# Patient Record
Sex: Male | Born: 1995 | Hispanic: Yes | Smoking: Current every day smoker
Health system: Southern US, Community
[De-identification: ages and names within clinical notes are randomized; demographics above are authoritative.]

---

## 2018-03-14 ENCOUNTER — Other Ambulatory Visit: Payer: Self-pay

## 2018-03-14 ENCOUNTER — Emergency Department: Payer: Self-pay

## 2018-03-14 ENCOUNTER — Emergency Department
Admission: EM | Admit: 2018-03-14 | Discharge: 2018-03-14 | Disposition: A | Discharge status: Home / Self Care | Payer: Self-pay | Attending: Emergency Medicine | Admitting: Emergency Medicine

## 2018-03-14 DIAGNOSIS — M79644 Pain in right finger(s): Secondary | ICD-10-CM | POA: Insufficient documentation

## 2018-03-14 DIAGNOSIS — M7989 Other specified soft tissue disorders: Secondary | ICD-10-CM

## 2018-03-14 DIAGNOSIS — R2231 Localized swelling, mass and lump, right upper limb: Secondary | ICD-10-CM | POA: Insufficient documentation

## 2018-03-14 DIAGNOSIS — W19XXXA Unspecified fall, initial encounter: Secondary | ICD-10-CM

## 2018-03-14 MED ORDER — IBUPROFEN 400 MG PO TABS: 400.0000 mg | ORAL_TABLET | Freq: Three times a day (TID) | ORAL | 0 refills | Status: DC | PRN

## 2018-03-14 NOTE — ED Notes (Signed)
Trip and fall today, report pain to right index finger, swelling and bruising noted when compared to left index finger.

## 2018-03-14 NOTE — ED Provider Notes (Signed)
[facility] Emergency Department Provider Note ____________________________________________  Time seen: 2045  I have reviewed the triage vital signs and the nursing notes.  HISTORY  Chief Complaint  Finger Injury   HPI Jeremy Davila is a 23 y.o. male presents to the ER today with complaint of Davila index finger pain and swelling.  Jeremy reports approximately 2 hours prior to Davila, Jeremy Davila hand.  Jeremy describes the pain as throbbing.  Jeremy denies numbness, tingling or weakness.  Jeremy did not take any medication prior to Davila.  No past medical history on file.  There are no active problems to display for this patient.   Prior to Admission medications   Medication Sig Start Date End Date Taking? Authorizing Provider  ibuprofen (ADVIL,MOTRIN) 400 MG tablet Take 1 tablet (400 mg total) by mouth every 8 (eight) hours as needed. 03/14/18   Lorre Munroe, NP    Allergies Patient has no known allergies.  No family history on file.  Social History Social History   Tobacco Use  . Smoking status: Not on file  Substance Use Topics  . Alcohol use: Not on file  . Drug use: Not on file    Review of Systems  Constitutional: Negative for fever, chills or body aches. Cardiovascular: Negative for chest pain. Respiratory: Negative for shortness of breath. Musculoskeletal: Positive for Davila index finger pain and swelling.   Skin: Positive for bruising of Davila index finger. Neurological: Negative for focal weakness, tingling or numbness. ____________________________________________  PHYSICAL EXAM:  VITAL SIGNS: ED Triage Vitals [03/14/18 1954]  Enc Vitals Group     BP (!) 141/85     Pulse Rate 74     Resp 18     Temp 98.4 F (36.9 C)     Temp Source Oral     SpO2 95 %     Weight 200 lb (90.7 kg)     Height 5\' 9"  (1.753 m)     Head Circumference      Peak Flow      Pain Score 0     Pain Loc    Pain Edu?      Excl. in GC?     Constitutional: Alert and oriented. Well appearing and in no distress. Cardiovascular: Normal rate, regular rhythm.  Radial pulses 2+ bilaterally.  Cap refill less than 3 seconds in Davila index finger. Respiratory: Normal respiratory effort. No wheezes/rales/rhonchi. Gastrointestinal: Soft and nontender. No distention. Musculoskeletal: Normal flexion and extension of the Davila index finger.  Pain with palpation proximal to the PIP Davila index finger.  Handgrips equal. Neurologic: Normal speech and language. No gross focal neurologic deficits are appreciated. Skin: Bruising noted over PIP, Davila index finger..  ______________________________   RADIOLOGY  Imaging Orders     DG Finger Index Davila  ____________________________________________  PROCEDURES  .Splint Application Date/Time: 03/14/2018 8:58 PM Performed by: Rosalio Loud Authorized by: Lorre Munroe, NP   Consent:    Consent obtained:  Verbal   Consent given by:  Patient   Risks discussed:  Discoloration, numbness and swelling   Alternatives discussed:  No treatment Pre-procedure details:    Sensation:  Normal Procedure details:    Laterality:  Davila   Location:  Finger   Finger:  R index finger   Strapping: no     Splint type:  Finger   Supplies:  Aluminum splint Post-procedure details:    Pain:  Improved   Sensation:  Normal   Patient tolerance of procedure:  Tolerated well, no immediate complications   ____________________________________________  INITIAL IMPRESSION / ASSESSMENT AND PLAN / ED COURSE  Davila Index Finger Pain and Swelling s/p Fall:  Xray negative Finger placed in splint for comfort RX for Ibuprofen 400 mg TID prn with food Ice for 10 minutes 2 x day ____________________________________________  FINAL CLINICAL IMPRESSION(S) / ED DIAGNOSES  Final diagnoses:  Pain in finger of Davila hand  Finger swelling  Fall, initial encounter    Nicki Reaper, NP    Lorre Munroe, NP 03/14/18 2103    Jeanmarie Plant, MD 03/14/18 2200

## 2018-03-14 NOTE — ED Triage Notes (Signed)
Patient reports tripped over a toy and fell injuring right index finger.

## 2018-03-14 NOTE — Discharge Instructions (Signed)
You have been diagnosed with a finger sprain.  Your x-ray was negative for fracture.  We have placed you in a splint that I want you to wear for 1 week.  You may take off while showering.  I am given you a prescription for ibuprofen that you can take every 8 as hours as needed for pain and swelling.  You can apply ice for 10 minutes twice daily to help improve swelling.

## 2020-04-05 ENCOUNTER — Emergency Department (HOSPITAL_COMMUNITY): Payer: No Typology Code available for payment source

## 2020-04-05 ENCOUNTER — Encounter (HOSPITAL_COMMUNITY): Payer: Self-pay | Admitting: Emergency Medicine

## 2020-04-05 ENCOUNTER — Emergency Department (HOSPITAL_COMMUNITY)
Admission: EM | Admit: 2020-04-05 | Discharge: 2020-04-05 | Disposition: A | Discharge status: Home / Self Care | Payer: No Typology Code available for payment source | Attending: Emergency Medicine | Admitting: Emergency Medicine

## 2020-04-05 ENCOUNTER — Other Ambulatory Visit: Payer: Self-pay

## 2020-04-05 DIAGNOSIS — N451 Epididymitis: Secondary | ICD-10-CM | POA: Diagnosis not present

## 2020-04-05 DIAGNOSIS — F17200 Nicotine dependence, unspecified, uncomplicated: Secondary | ICD-10-CM | POA: Insufficient documentation

## 2020-04-05 DIAGNOSIS — N50811 Right testicular pain: Secondary | ICD-10-CM | POA: Diagnosis present

## 2020-04-05 LAB — URINALYSIS, ROUTINE W REFLEX MICROSCOPIC
Bilirubin Urine: NEGATIVE
Glucose, UA: NEGATIVE mg/dL
Hgb urine dipstick: NEGATIVE
Ketones, ur: NEGATIVE mg/dL
Leukocytes,Ua: NEGATIVE
Nitrite: NEGATIVE
Protein, ur: NEGATIVE mg/dL
Specific Gravity, Urine: 1.02 (ref 1.005–1.030)
pH: 8 (ref 5.0–8.0)

## 2020-04-05 LAB — HIV ANTIBODY (ROUTINE TESTING W REFLEX): HIV Screen 4th Generation wRfx: NONREACTIVE

## 2020-04-05 MED ORDER — DOXYCYCLINE HYCLATE 100 MG PO CAPS: 100.0000 mg | ORAL_CAPSULE | Freq: Two times a day (BID) | ORAL | 0 refills | Status: DC

## 2020-04-05 MED ORDER — CEFTRIAXONE SODIUM 500 MG IJ SOLR
500.0000 mg | Freq: Once | INTRAMUSCULAR | Status: AC
  Administered 2020-04-05: 500 mg via INTRAMUSCULAR
  Filled 2020-04-05: qty 500

## 2020-04-05 MED ORDER — LIDOCAINE HCL (PF) 1 % IJ SOLN
INTRAMUSCULAR | Status: AC
  Administered 2020-04-05: 1 mL
  Filled 2020-04-05: qty 5

## 2020-04-05 NOTE — ED Provider Notes (Signed)
[facility] EMERGENCY DEPARTMENT Provider Note   CSN: [id number] Arrival date & time: 04/05/20  1232     History Chief Complaint  Patient presents with  . Groin Pain    Jung Yurchak is a 25 y.o. male.  The history is provided by the patient. No language interpreter was used.  Groin Pain     25 year old male presenting complaining of testicle pain.  Patient report for the past 3 days he has noticed pain and swelling to his right testicle.  Pain is dull mild at rest and with intermittent sharp pain with movement.  Pain is nonradiating, mildly improved with cool compress but no improvement with ibuprofen.  No associated fever chills no abdominal pain back pain dysuria hematuria penile discharge or rash.  No recent injury.  Patient is sexually active with same sex partner not using protection.  Denies any prior history of STI.  No report of any history of cancer.    History reviewed. No pertinent past medical history.  There are no problems to display for this patient.   History reviewed. No pertinent surgical history.     No family history on file.  Social History   Tobacco Use  . Smoking status: Current Every Day Smoker  . Smokeless tobacco: Never Used  Substance Use Topics  . Alcohol use: Not Currently  . Drug use: Not Currently    Home Medications Prior to Admission medications   Medication Sig Start Date End Date Taking? Authorizing Provider  ibuprofen (ADVIL,MOTRIN) 400 MG tablet Take 1 tablet (400 mg total) by mouth every 8 (eight) hours as needed. 03/14/18   Lorre Munroe, NP    Allergies    Patient has no known allergies.  Review of Systems   Review of Systems  Constitutional: Negative for fever.  Genitourinary: Positive for testicular pain. Negative for difficulty urinating, genital sores, penile discharge and scrotal swelling.  Skin: Negative for rash and wound.  Neurological: Negative for numbness.    Physical Exam Updated  Vital Signs BP 133/82 (BP Location: Left Arm)   Pulse 68   Temp 99.4 F (37.4 C)   Resp 16   SpO2 98%   Physical Exam Vitals and nursing note reviewed.  Constitutional:      General: He is not in acute distress.    Appearance: He is well-developed and well-nourished.  HENT:     Head: Atraumatic.  Eyes:     Conjunctiva/sclera: Conjunctivae normal.  Abdominal:     Palpations: Abdomen is soft.     Tenderness: There is no abdominal tenderness.  Genitourinary:    Comments: Kiara, PA-S available to chaperone.  No inguinal lymphadenopathy or inguinal hernia noted.  Normal uncircumcised penis free of lesion or rash.  No penile discharge noted.  Right testicle with a firm mass noted in the inferior aspect tender to palpation.  Left testicle normal normal lie.  Normal scrotum, perineum soft and nontender. Musculoskeletal:     Cervical back: Neck supple.  Skin:    Findings: No rash.  Neurological:     Mental Status: He is alert.  Psychiatric:        Mood and Affect: Mood and affect normal.     ED Results / Procedures / Treatments   Labs (all labs ordered are listed, but only abnormal results are displayed) Labs Reviewed  URINALYSIS, ROUTINE W REFLEX MICROSCOPIC  RPR  HIV ANTIBODY (ROUTINE TESTING W REFLEX)  GC/CHLAMYDIA PROBE AMP ([facility]) NOT AT [facility]  EKG None  Radiology US SCROTUM W/DOPPLER  Result Date: 04/05/2020 CLINICAL DATA:  Left testicular pain x3 days, no fever no trauma EXAM: SCROTAL ULTRASOUND DOPPLER ULTRASOUND OF THE TESTICLES TECHNIQUE: Complete ultrasound examination of the testicles, epididymis, and other scrotal structures was performed. Color and spectral Doppler ultrasound were also utilized to evaluate blood flow to the testicles. COMPARISON:  None. FINDINGS: Right testicle Measurements: 3.6 x 2.3 x 1.9 cm. No mass or microlithiasis visualized. Left testicle Measurements: 3.7 x 3.1 x 1.7 cm. No mass or microlithiasis visualized. Right epididymis:  Slightly increased size and vascularity of the epididymal tail. Left epididymis:  Normal in size and appearance. Hydrocele:  Tiny bilateral. Varicocele:  None visualized. Pulsed Doppler interrogation of both testes demonstrates normal low resistance arterial and venous waveforms bilaterally. IMPRESSION: 1. Slightly increased size and vascularity of the right epididymal tail. Findings could reflect early epididymitis. 2. The bilateral testicles proper are normal in size and appearance with symmetric arterial and venous Doppler flow. 3. Tiny bilateral hydroceles. Electronically Signed   By: Maudry Mayhew MD   On: 04/05/2020 14:45    Procedures Procedures   Medications Ordered in ED Medications  cefTRIAXone (ROCEPHIN) injection 500 mg (has no administration in time range)    ED Course  I have reviewed the triage vital signs and the nursing notes.  Pertinent labs & imaging results that were available during my care of the patient were reviewed by me and considered in my medical decision making (see chart for details).    MDM Rules/Calculators/A&P                          BP 133/82 (BP Location: Left Arm)   Pulse 68   Temp 99.4 F (37.4 C)   Resp 16   SpO2 98%   Final Clinical Impression(s) / ED Diagnoses Final diagnoses:  Epididymitis, right    Rx / DC Orders ED Discharge Orders         Ordered    doxycycline (VIBRAMYCIN) 100 MG capsule  2 times daily        04/05/20 1453         2:33 PM Patient here with right testicle pain for the past several days.  He is sexually active with same sex partner not using protection therefore he is at an increased risk of STI due to risky sexual practices.  On exam patient has palpable mass noted to the inferior aspect of the right testicle without scrotal involvement.  Area is tender to palpation.  Will obtain ultrasound for further evaluation.  Concerning for testicular cancer, but will screen for epidydimitis, orchitis, testicular  abscess.  2:51 PM Scrotal swelling slight increased size and vascularity of the right epididymal tail finding could relate to early epididymitis.  Bilateral testicle probably normal in size with out evidence of testicular torsion.  Tiny bilateral hydroceles.  Given risky sexual practices, patient will receive Rocephin 500 g IM and will go home with +2 weeks as treatment for epididymitis.  Recommend patient to avoid sexual activities until symptoms improve and protection to decrease risk of STI.   Fayrene Helper, PA-C 04/05/20 1455    Melene Plan, DO 04/05/20 [phone]

## 2020-04-05 NOTE — ED Triage Notes (Signed)
C/o R testicle pain and swelling since Saturday.

## 2020-04-05 NOTE — Discharge Instructions (Signed)
Your testicular pain is likely due to epididymitis which is inflammation to epididymal lesion.  Please take antibiotic as prescribed.  Always use protection during sexual activities to decrease risk of infection.  Follow-up on your STI results through MyChart.

## 2020-04-06 LAB — RPR: RPR Ser Ql: NONREACTIVE

## 2020-04-06 LAB — GC/CHLAMYDIA PROBE AMP (~~LOC~~) NOT AT ARMC
Chlamydia: POSITIVE — AB
Comment: NEGATIVE
Comment: NORMAL
Neisseria Gonorrhea: NEGATIVE

## 2020-05-04 DIAGNOSIS — K589 Irritable bowel syndrome without diarrhea: Secondary | ICD-10-CM | POA: Insufficient documentation

## 2020-05-30 ENCOUNTER — Ambulatory Visit: Payer: No Typology Code available for payment source | Admitting: Internal Medicine

## 2020-05-30 DIAGNOSIS — Z5329 Procedure and treatment not carried out because of patient's decision for other reasons: Secondary | ICD-10-CM

## 2020-05-30 DIAGNOSIS — Z91199 Patient's noncompliance with other medical treatment and regimen due to unspecified reason: Secondary | ICD-10-CM

## 2020-05-30 NOTE — Progress Notes (Signed)
Pt's appointment was rescheduled due to having a fever (99.7)

## 2020-06-06 ENCOUNTER — Ambulatory Visit: Payer: No Typology Code available for payment source

## 2020-06-27 ENCOUNTER — Ambulatory Visit (INDEPENDENT_AMBULATORY_CARE_PROVIDER_SITE_OTHER): Payer: No Typology Code available for payment source | Admitting: Internal Medicine

## 2020-06-27 VITALS — BP 131/78 | HR 73 | Resp 16 | Ht 68.0 in | Wt 196.0 lb

## 2020-06-27 DIAGNOSIS — E663 Overweight: Secondary | ICD-10-CM | POA: Diagnosis not present

## 2020-06-27 DIAGNOSIS — G4733 Obstructive sleep apnea (adult) (pediatric): Secondary | ICD-10-CM | POA: Diagnosis not present

## 2020-06-27 DIAGNOSIS — G4710 Hypersomnia, unspecified: Secondary | ICD-10-CM | POA: Diagnosis not present

## 2020-06-27 NOTE — Progress Notes (Signed)
Sleep Medicine   Office Visit  Patient Name: Lenwood Balsam DOB: 22-Jun-1995 MRN [id number]    Chief Complaint: initial evaluation.   Brief History:  Lamine presents with a  history of daytime sleepiness.  Sleep quality is good, but is not restorative.  This is noted most nights. The patient reports waking gasping for air. The patient has been told he snores and there has been witnessed apneas. 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  he reports that his sleep.   Patient has noted no movement of his legs at night.  The patient  relates one episode of sleep walking  behavior during the night.  The patient denies a history of psychiatric problems. The Epworth Sleepiness Score is 12 out of 24 .  The patient relates  Cardiovascular risk factors include: none.     ROS  General: (-) fever, (-) chills, (-) night sweat Nose and Sinuses: (-) nasal stuffiness or itchiness, (-) postnasal drip, (-) nosebleeds, (-) sinus trouble. Mouth and Throat: (-) sore throat, (-) hoarseness. Neck: (-) swollen glands, (-) enlarged thyroid, (-) neck pain. Respiratory: - cough, + shortness of breath with exertion,  - wheezing. Neurologic: - numbness, - tingling. Psychiatric: - anxiety, - depression Sleep behavior: +sleep paralysis -hypnogogic hallucinations -dream enactment      -vivid dreams -cataplexy -night terrors +sleep walking   Current Medication: Outpatient Encounter Medications as of 06/27/2020  Medication Sig  . Multiple Vitamin (MULTIVITAMIN ADULT PO) Take 1 tablet by mouth daily.  . [DISCONTINUED] doxycycline (VIBRAMYCIN) 100 MG capsule Take 1 capsule (100 mg total) by mouth 2 (two) times daily. One po bid x 14 days   No facility-administered encounter medications on file as of 06/27/2020.    Surgical History: History reviewed. No  pertinent surgical history.  Medical History: History reviewed. No pertinent past medical history.  Family History: Non contributory to the present illness  Social History: Social History   Socioeconomic History  . Marital status: Significant Other    Spouse name: Not on file  . Number of children: Not on file  . Years of education: Not on file  . Highest education level: Not on file  Occupational History  . Not on file  Tobacco Use  . Smoking status: Current Every Day Smoker    Types: E-cigarettes  . Smokeless tobacco: Never Used  Substance and Sexual Activity  . Alcohol use: Not Currently  . Drug use: Not Currently  . Sexual activity: Not on file  Other Topics Concern  . Not on file  Social History Narrative  . Not on file   Social Determinants of Health   Financial Resource Strain: Not on file  Food Insecurity: Not on file  Transportation Needs: Not on file  Physical Activity: Not on file  Stress: Not on file  Social Connections: Not on file  Intimate Partner Violence: Not on file    Vital Signs: Blood pressure 131/78, pulse 73, resp. rate 16, height 5\' 8"  (1.727 m), weight 196 lb (88.9 kg), SpO2 97 %.  Examination: General Appearance: The patient is well-developed, well-nourished, and in no distress. Neck Circumference: 38 Skin: Gross inspection of skin unremarkable. Head: normocephalic, no gross deformities. Eyes: no gross deformities noted. ENT: ears appear grossly normal Neurologic: Alert and oriented. No involuntary movements.    EPWORTH SLEEPINESS SCALE:  Scale:  (0)= no chance  of dozing; (1)= slight chance of dozing; (2)= moderate chance of dozing; (3)= high chance of dozing  Chance  Situtation    Sitting and reading: 2    Watching TV: 2     Sitting Inactive in public: 1    As a passenger in car. 2      Lying down to rest: 2    Sitting and talking: 1    Sitting quielty after lunch: 1    In a car, stopped in traffic 1   TOTAL  SCORE:  12 out of 24    SLEEP STUDIES:  1. No studies on file   LABS: Recent Results (from the past 2160 hour(s))  Urinalysis, Routine w reflex microscopic     Status: None   Collection Time: 04/05/20 12:43 PM  Result Value Ref Range   Color, Urine YELLOW YELLOW   APPearance CLEAR CLEAR   Specific Gravity, Urine 1.020 1.005 - 1.030   pH 8.0 5.0 - 8.0   Glucose, UA NEGATIVE NEGATIVE mg/dL   Hgb urine dipstick NEGATIVE NEGATIVE   Bilirubin Urine NEGATIVE NEGATIVE   Ketones, ur NEGATIVE NEGATIVE mg/dL   Protein, ur NEGATIVE NEGATIVE mg/dL   Nitrite NEGATIVE NEGATIVE   Leukocytes,Ua NEGATIVE NEGATIVE    Comment: Performed at [facility], [address], [place], [place] [address]  GC/Chlamydia probe amp     Status: Abnormal   Collection Time: 04/05/20  2:22 PM  Result Value Ref Range   Neisseria Gonorrhea Negative    Chlamydia Positive (A)    Comment Normal Reference Ranger Chlamydia - Negative    Comment      Normal Reference Range Neisseria Gonorrhea - Negative  RPR     Status: None   Collection Time: 04/05/20  2:30 PM  Result Value Ref Range   RPR Ser Ql NON REACTIVE NON REACTIVE    Comment: Performed at [facility], [address], [place], [place] [address]  HIV Antibody (routine testing w rflx)     Status: None   Collection Time: 04/05/20  2:30 PM  Result Value Ref Range   HIV Screen 4th Generation wRfx Non Reactive Non Reactive    Comment: Performed at [facility], [address] [place], [place] [address]    Radiology: US SCROTUM W/DOPPLER  Result Date: 04/05/2020 CLINICAL DATA:  Left testicular pain x3 days, no fever no trauma EXAM: SCROTAL ULTRASOUND DOPPLER ULTRASOUND OF THE TESTICLES TECHNIQUE: Complete ultrasound examination of the testicles, epididymis, and other scrotal structures was performed. Color and spectral Doppler ultrasound were also utilized to evaluate blood flow to the testicles. COMPARISON:  None. FINDINGS: Right testicle  Measurements: 3.6 x 2.3 x 1.9 cm. No mass or microlithiasis visualized. Left testicle Measurements: 3.7 x 3.1 x 1.7 cm. No mass or microlithiasis visualized. Right epididymis: Slightly increased size and vascularity of the epididymal tail. Left epididymis:  Normal in size and appearance. Hydrocele:  Tiny bilateral. Varicocele:  None visualized. Pulsed Doppler interrogation of both testes demonstrates normal low resistance arterial and venous waveforms bilaterally. IMPRESSION: 1. Slightly increased size and vascularity of the right epididymal tail. Findings could reflect early epididymitis. 2. The bilateral testicles proper are normal in size and appearance with symmetric arterial and venous Doppler flow. 3. Tiny bilateral hydroceles. Electronically Signed   By: Maudry Mayhew MD   On: 04/05/2020 14:45    No results found.  No results found.    Assessment and Plan: Patient Active Problem List  Diagnosis Date Noted  . OSA (obstructive sleep apnea) 06/27/2020  . Hypersomnia 06/27/2020  . Overweight 06/27/2020   1. OSA (obstructive sleep apnea) PLAN OSA:   Patient evaluation suggests high risk of sleep disordered breathing due to witnessed apneas and awakenings gasping for breath.   Suggest: PSG  to assess/treat the patient's sleep disordered breathing. The patient was also counselled on weight loss to optimize sleep health.    2. Hypersomnia PLAN hypersomnia:  Patient evaluation suggests significant daytime hypersomnia.  The Epworth Sleepiness Score is elevated at 12 out of 24.     3. Overweight Recommend weight loss.       General Counseling: I have discussed the findings of the evaluation and examination with Ignacia Bayley.  I have also discussed any further diagnostic evaluation thatmay be needed or ordered today. Wylie verbalizes understanding of the findings of todays visit. We also reviewed his medications today and discussed drug interactions and side effects including but not  limited excessive drowsiness and altered mental states. We also discussed that there is always a risk not just to him but also people around him. he has been encouraged to call the office with any questions or concerns that should arise related to todays visit.  No orders of the defined types were placed in this encounter.       I have personally obtained a history, evaluated the patient, evaluated pertinent data, formulated the assessment and plan and placed orders.    This patient was seen today by Emmaline Kluver, PA-C in collaboration with Dr. Freda Munro.   Yevonne Pax, MD [facility] Diplomate ABMS Pulmonary and Critical Care Medicine Sleep medicine

## 2022-05-01 IMAGING — US US SCROTUM W/ DOPPLER COMPLETE
1 series · 14 of 25 positions shown · non-contrast
Comparison: None.

CLINICAL DATA: Left testicular pain x3 days, no fever no trauma

EXAM:
SCROTAL ULTRASOUND
DOPPLER ULTRASOUND OF THE TESTICLES
TECHNIQUE: Complete ultrasound examination of the testicles, epididymis, and
other scrotal structures was performed. Color and spectral Doppler
ultrasound were also utilized to evaluate blood flow to the
testicles.

[Series 1: us scrotum w/doppler · 14 of 88 slices shown]
[im 1/88]
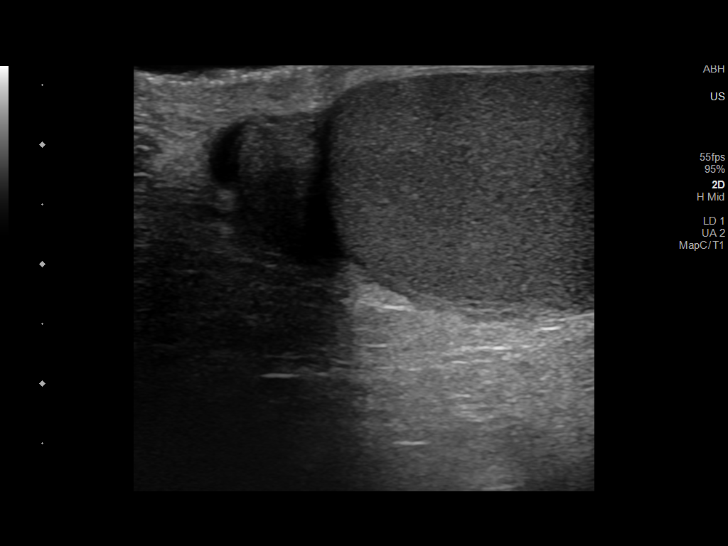
[im 8/88]
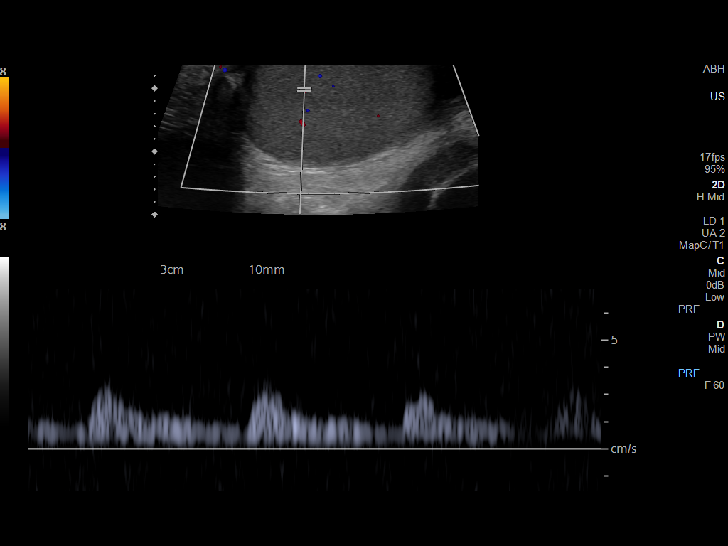
[im 15/88]
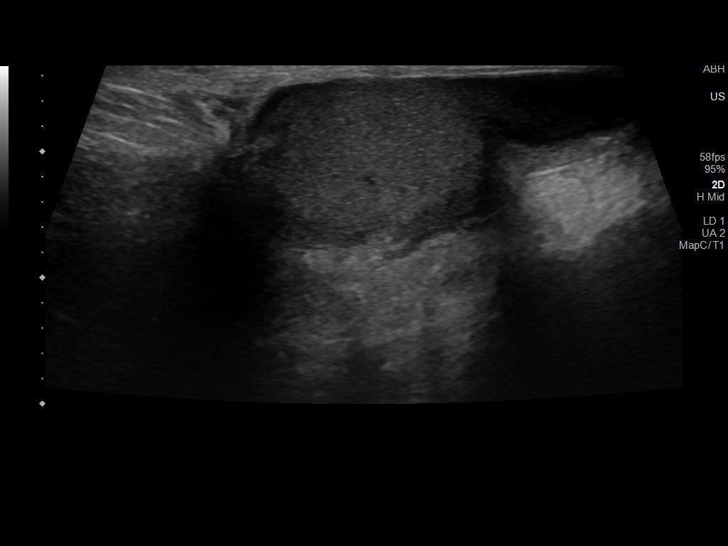
[im 22/88]
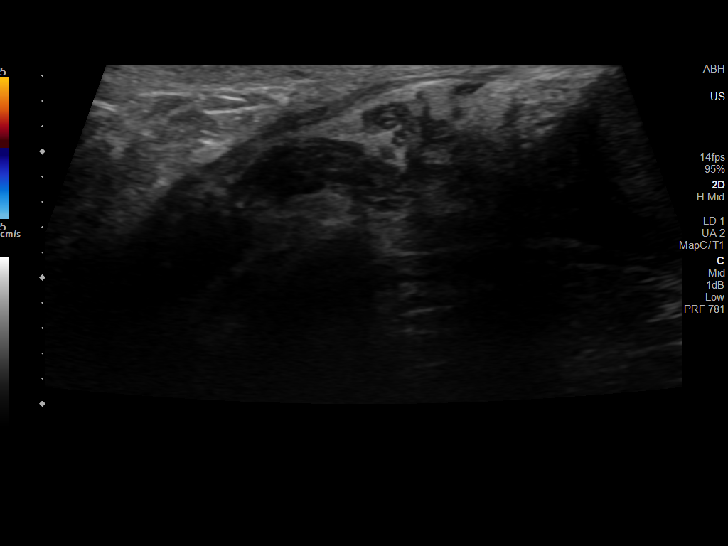
[im 30/88]
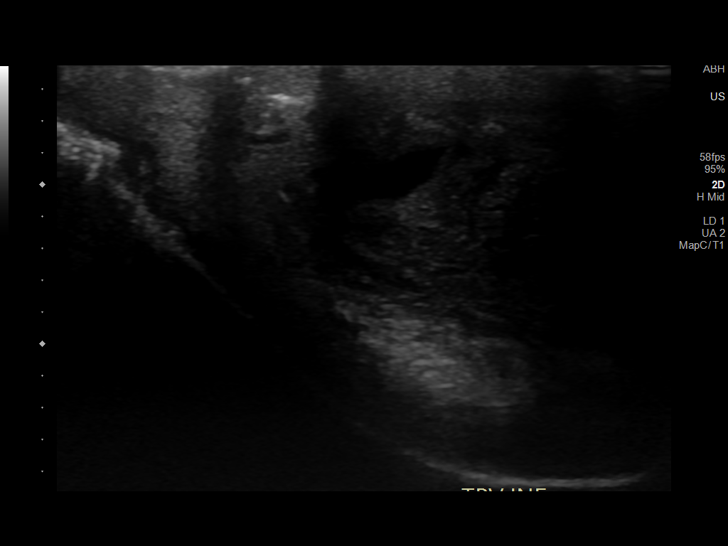
[im 33/88]
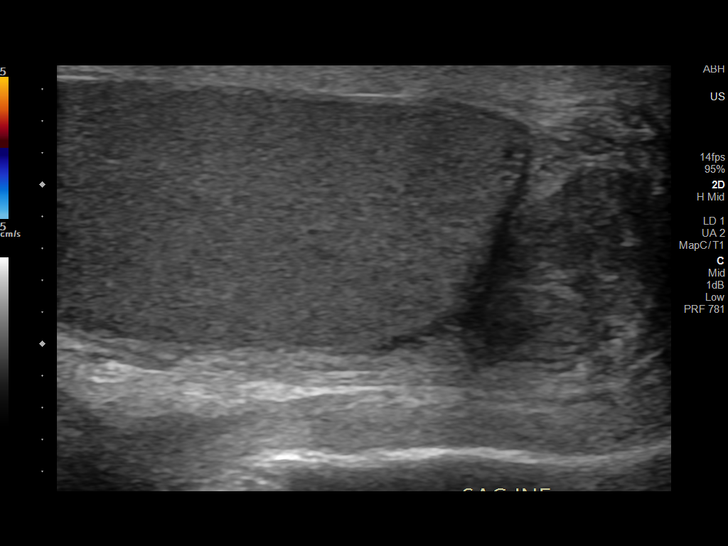
[im 40/88]
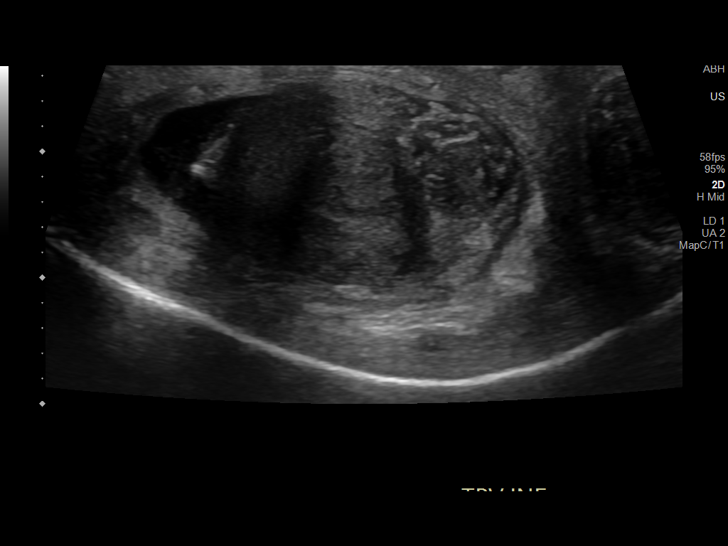
[im 48/88]
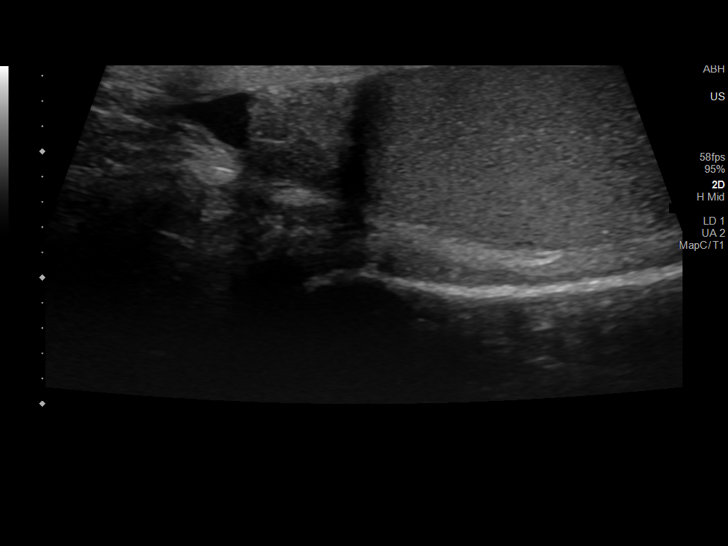
[im 55/88]
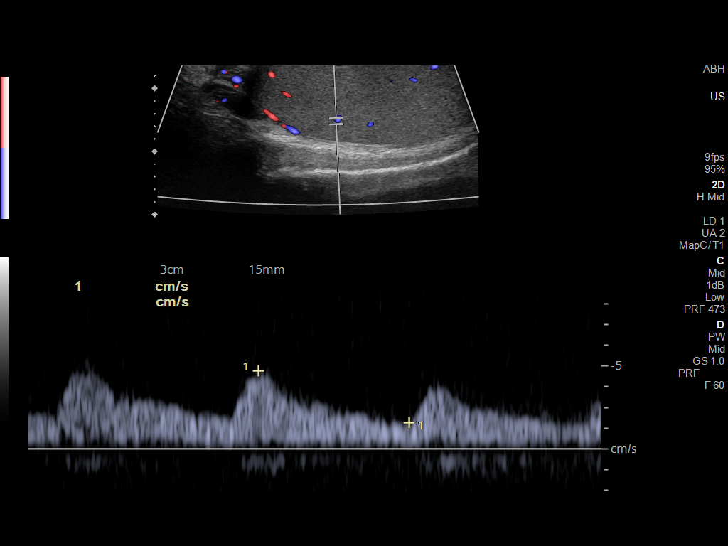
[im 59/88]
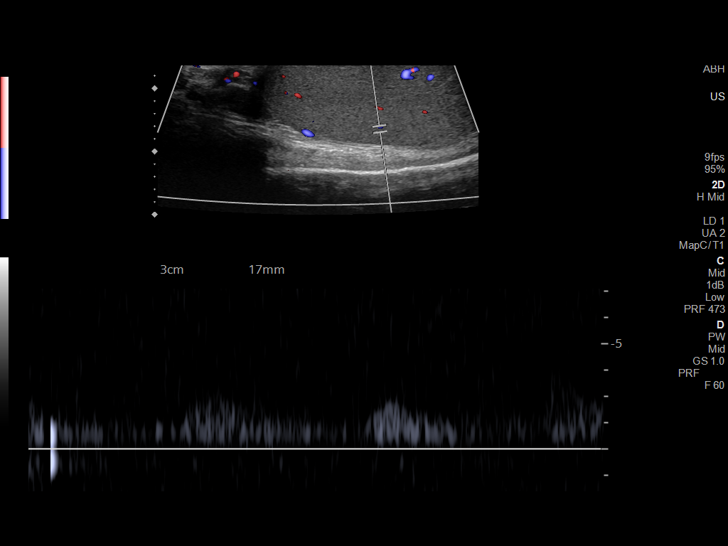
[im 66/88]
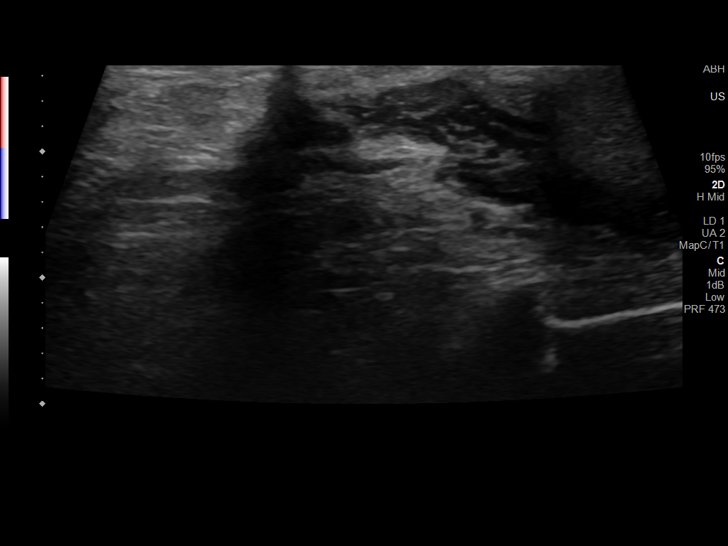
[im 73/88]
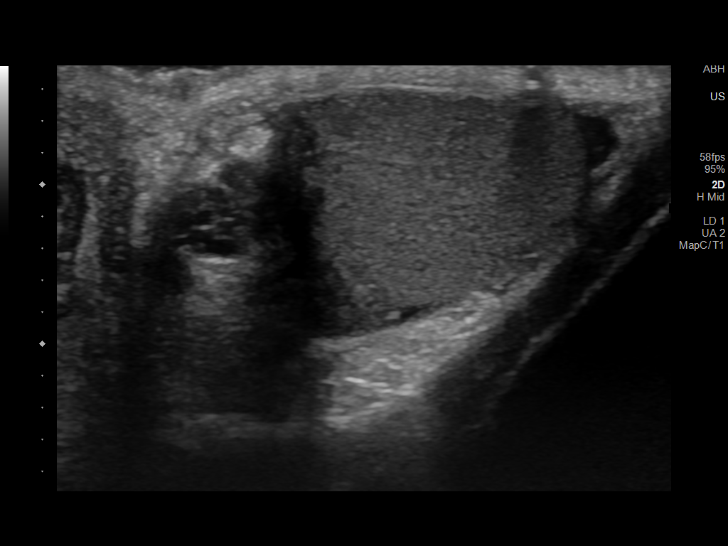
[im 80/88]
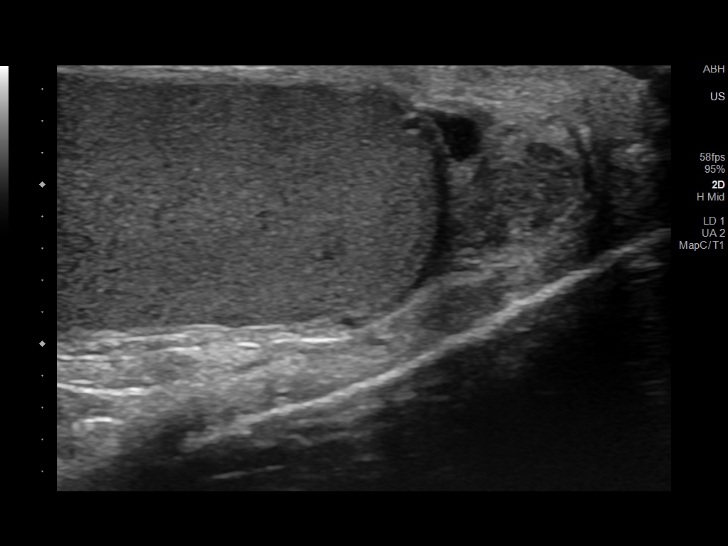
[im 88/88]
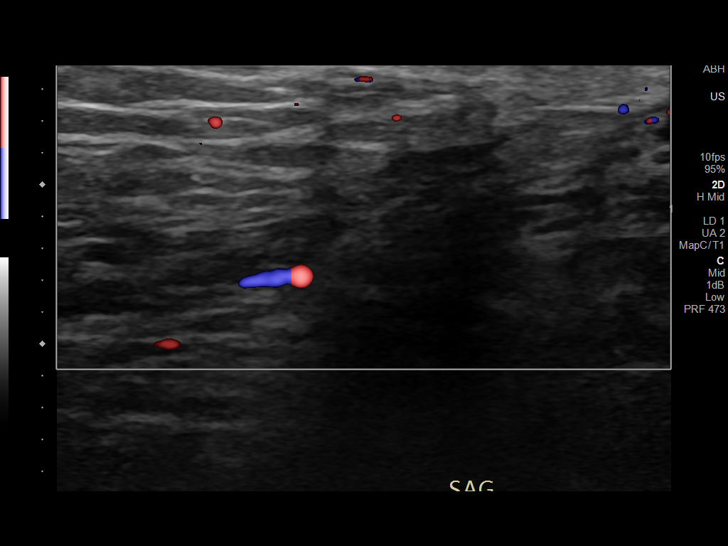

[14 of 25 positions shown; findings below may reference images not displayed]

FINDINGS: Right testicle

Measurements: 3.6 x 2.3 x 1.9 cm. No mass or microlithiasis
visualized.

Left testicle

Measurements: 3.7 x 3.1 x 1.7 cm. No mass or microlithiasis
visualized.

Right epididymis: Slightly increased size and vascularity of the
epididymal tail.

Left epididymis:  Normal in size and appearance.

Hydrocele:  Tiny bilateral.

Varicocele:  None visualized.

Pulsed Doppler interrogation of both testes demonstrates normal low
resistance arterial and venous waveforms bilaterally.
IMPRESSION: 1. Slightly increased size and vascularity of the right epididymal
tail. Findings could reflect early epididymitis.
2. The bilateral testicles proper are normal in size and appearance
with symmetric arterial and venous Doppler flow.
3. Tiny bilateral hydroceles.

## 2023-11-05 ENCOUNTER — Ambulatory Visit: Admitting: Pediatrics

## 2023-12-11 ENCOUNTER — Ambulatory Visit: Admitting: Pediatrics

## 2024-02-22 NOTE — Patient Instructions (Incomplete)
 Healthy Eating, Adult Healthy eating may help you get and keep a healthy body weight, reduce the risk of chronic disease, and live a long and productive life. It is important to follow a healthy eating pattern. Your nutritional and calorie needs should be met mainly by different nutrient-rich foods. What are tips for following this plan? Reading food labels Read labels and choose the following: Reduced or low sodium products. Juices with 100% fruit juice. Foods with low saturated fats (<3 g per serving) and high polyunsaturated and monounsaturated fats. Foods with whole grains, such as whole wheat, cracked wheat, brown rice, and wild rice. Whole grains that are fortified with folic acid . This is recommended for females who are pregnant or who want to become pregnant. Read labels and do not eat or drink the following: Foods or drinks with added sugars. These include foods that contain brown sugar, corn sweetener, corn syrup, dextrose , fructose, glucose, high-fructose corn syrup, honey, invert sugar, lactose, malt syrup, maltose, molasses, raw sugar, sucrose, trehalose, or turbinado sugar. Limit your intake of added sugars to less than 10% of your total daily calories. Do not eat more than the following amounts of added sugar per day: 6 teaspoons (25 g) for females. 9 teaspoons (38 g) for males. Foods that contain processed or refined starches and grains. Refined grain products, such as white flour, degermed cornmeal, white bread, and white rice. Shopping Choose nutrient-rich snacks, such as vegetables, whole fruits, and nuts. Avoid high-calorie and high-sugar snacks, such as potato chips, fruit snacks, and candy. Use oil-based dressings and spreads on foods instead of solid fats such as butter, margarine, sour cream, or cream cheese. Limit pre-made sauces, mixes, and instant products such as flavored rice, instant noodles, and ready-made pasta. Try more plant-protein sources, such as tofu,  tempeh, black beans, edamame, lentils, nuts, and seeds. Explore eating plans such as the Mediterranean diet or vegetarian diet. Try heart-healthy dips made with beans and healthy fats like hummus and guacamole. Vegetables go great with these. Cooking Use oil to saut or stir-fry foods instead of solid fats such as butter, margarine, or lard. Try baking, boiling, grilling, or broiling instead of frying. Remove the fatty part of meats before cooking. Steam vegetables in water  or broth. Meal planning  At meals, imagine dividing your plate into fourths: One-half of your plate is fruits and vegetables. One-fourth of your plate is whole grains. One-fourth of your plate is protein, especially lean meats, poultry, eggs, tofu, beans, or nuts. Include low-fat dairy as part of your daily diet. Lifestyle Choose healthy options in all settings, including home, work, school, restaurants, or stores. Prepare your food safely: Wash your hands after handling raw meats. Where you prepare food, keep surfaces clean by regularly washing with hot, soapy water . Keep raw meats separate from ready-to-eat foods, such as fruits and vegetables. Cook seafood, meat, poultry, and eggs to the recommended temperature. Get a food thermometer. Store foods at safe temperatures. In general: Keep cold foods at 34F (4.4C) or below. Keep hot foods at 134F (60C) or above. Keep your freezer at [facility] (-17.8C) or below. Foods are not safe to eat if they have been between the temperatures of 40-134F (4.4-60C) for more than 2 hours. What foods should I eat? Fruits Aim to eat 1-2 cups of fresh, canned (in natural juice), or frozen fruits each day. One cup of fruit equals 1 small apple, 1 large banana, 8 large strawberries, 1 cup (237 g) canned fruit,  cup (82 g) dried fruit,  or 1 cup (240 mL) 100% juice. Vegetables Aim to eat 2-4 cups of fresh and frozen vegetables each day, including different varieties and colors. One cup  of vegetables equals 1 cup (91 g) broccoli or cauliflower florets, 2 medium carrots, 2 cups (150 g) raw, leafy greens, 1 large tomato, 1 large bell pepper, 1 large sweet potato, or 1 medium white potato. Grains Aim to eat 5-10 ounce-equivalents of whole grains each day. Examples of 1 ounce-equivalent of grains include 1 slice of bread, 1 cup (40 g) ready-to-eat cereal, 3 cups (24 g) popcorn, or  cup (93 g) cooked rice. Meats and other proteins Try to eat 5-7 ounce-equivalents of protein each day. Examples of 1 ounce-equivalent of protein include 1 egg,  oz nuts (12 almonds, 24 pistachios, or 7 walnut halves), 1/4 cup (90 g) cooked beans, 6 tablespoons (90 g) hummus or 1 tablespoon (16 g) peanut butter. A cut of meat or fish that is the size of a deck of cards is about 3-4 ounce-equivalents (85 g). Of the protein you eat each week, try to have at least 8 sounce (227 g) of seafood. This is about 2 servings per week. This includes salmon, trout, herring, sardines, and anchovies. Dairy Aim to eat 3 cup-equivalents of fat-free or low-fat dairy each day. Examples of 1 cup-equivalent of dairy include 1 cup (240 mL) milk, 8 ounces (250 g) yogurt, 1 ounces (44 g) natural cheese, or 1 cup (240 mL) fortified soy milk. Fats and oils Aim for about 5 teaspoons (21 g) of fats and oils per day. Choose monounsaturated fats, such as canola and olive oils, mayonnaise made with olive oil or avocado oil, avocados, peanut butter, and most nuts, or polyunsaturated fats, such as sunflower, corn, and soybean oils, walnuts, pine nuts, sesame seeds, sunflower seeds, and flaxseed. Beverages Aim for 6 eight-ounce glasses of water  per day. Limit coffee to 3-5 eight-ounce cups per day. Limit caffeinated beverages that have added calories, such as soda and energy drinks. If you drink alcohol: Limit how much you have to: 0-1 drink a day if you are male. 0-2 drinks a day if you are male. Know how much alcohol is in your drink.  In the [place] one drink is one 12 oz bottle of beer (355 mL), one 5 oz glass of wine (148 mL), or one 1 oz glass of hard liquor (44 mL). Seasoning and other foods Try not to add too much salt to your food. Try using herbs and spices instead of salt. Try not to add sugar to food. This information is based on [place] nutrition guidelines. To learn more, visit [employer]. Exact amounts may vary. You may need different amounts. This information is not intended to replace advice given to you by your health care provider. Make sure you discuss any questions you have with your health care provider. Document Revised: 10/30/2021 Document Reviewed: 10/30/2021 Elsevier Patient Education  2024 [employer].

## 2024-02-25 ENCOUNTER — Encounter: Payer: Self-pay | Admitting: Nurse Practitioner

## 2024-02-25 ENCOUNTER — Ambulatory Visit: Payer: Self-pay | Admitting: Nurse Practitioner

## 2024-02-25 VITALS — BP 134/87 | HR 70 | Temp 98.6°F | Resp 17 | Ht 67.99 in | Wt 193.4 lb

## 2024-02-25 DIAGNOSIS — E663 Overweight: Secondary | ICD-10-CM | POA: Diagnosis not present

## 2024-02-25 DIAGNOSIS — M79644 Pain in right finger(s): Secondary | ICD-10-CM | POA: Diagnosis not present

## 2024-02-25 DIAGNOSIS — Z7689 Persons encountering health services in other specified circumstances: Secondary | ICD-10-CM | POA: Diagnosis not present

## 2024-02-25 DIAGNOSIS — K589 Irritable bowel syndrome without diarrhea: Secondary | ICD-10-CM | POA: Diagnosis not present

## 2024-02-25 DIAGNOSIS — G4733 Obstructive sleep apnea (adult) (pediatric): Secondary | ICD-10-CM | POA: Diagnosis not present

## 2024-02-25 NOTE — Progress Notes (Signed)
 "  New Patient Office Visit  Subjective    Patient ID: Jeremy Davila, male    DOB: September 02, 1995  Age: 29 y.o. MRN: [id number]  CC:  Chief Complaint  Patient presents with   Establish Care    Establish care, hurt finger at work over two months ago still slightly swollen    HPI Jeremy Davila presents for new patient visit to establish care. Introduced to publishing rights manager role and practice setting. All questions answered. Discussed provider/patient relationship and expectations. Has not seen PCP in years. History of father having MI in mid-50's.  Has history of IBS noted on chart. Often goes multiple times before work, can be a little to none. Happens mid morning now to time he gets home. Somewhat formed stool. No cramping or pain with this.    SLEEP APNEA Has used for over 2 years. Sleep apnea status: controlled Duration: chronic Satisfied with current treatment?:  yes CPAP use:  yes Sleep quality with CPAP use: good, average Treament compliance:good compliance Last sleep study: > 2 years ago Treatments attempted: CPAP Wakes feeling refreshed:  yes Daytime hypersomnolence:  no Fatigue:  no Insomnia:  no Good sleep hygiene:  yes Difficulty falling asleep:  no Difficulty staying asleep:  no Snoring bothers bed partner:  no Observed apnea by bed partner: no Obesity:  yes Hypertension: no  Pulmonary hypertension:  no Coronary artery disease:  no  FINGER PAIN Hurt his finger at work, two months ago, 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 Left hand dominant. Finger at this time still feels stiff and hard to move. Not getting stuck on him. Duration: months Involved hand: right Mechanism of injury: trauma Location: dorsal lower aspect ring finger Onset: sudden Severity: 8-9/10 the first month, but now a 2/10  Quality: dull, aching, and throbbing Frequency: intermittent Radiation: no Aggravating factors: working at times Alleviating  factors: Ibuprofen  Treatments attempted: Ibuprofen  Relief with NSAIDs?: moderate Weakness: no Numbness: no Redness: no Swelling:yes Bruising: no Fevers: no   Reports today he feels good, work is a stressor. Has never taken medication for anxiety or depression.    02/25/2024   10:48 AM  Depression screen PHQ 2/9  Decreased Interest 1  Down, Depressed, Hopeless 1  PHQ - 2 Score 2  Altered sleeping 0  Tired, decreased energy 1  Change in appetite 0  Feeling bad or failure about yourself  0  Trouble concentrating 1  Moving slowly or fidgety/restless 2  Suicidal thoughts 0  PHQ-9 Score 6  Difficult doing work/chores Somewhat difficult       02/25/2024   10:50 AM  GAD 7 : Generalized Anxiety Score  Nervous, Anxious, on Edge 0  Control/stop worrying 1  Worry too much - different things 1  Trouble relaxing 0  Restless 0  Easily annoyed or irritable 1  Afraid - awful might happen 1  Total GAD 7 Score 4  Anxiety Difficulty Somewhat difficult   Outpatient Encounter Medications as of 02/25/2024  Medication Sig   [DISCONTINUED] amoxicillin (AMOXIL) 250 MG capsule Take 250 mg by mouth 3 (three) times daily.   [DISCONTINUED] Multiple Vitamin (MULTIVITAMIN ADULT PO) Take 1 tablet by mouth daily. (Patient not taking: Reported on 02/25/2024)   No facility-administered encounter medications on file as of 02/25/2024.    History reviewed. No pertinent past medical history.  History reviewed. No pertinent surgical history.  Family History  Problem Relation Age of Onset   Hypertension Mother  Hypertension Father    Heart attack Father     Social History   Socioeconomic History   Marital status: Significant Other    Spouse name: Not on file   Number of children: Not on file   Years of education: Not on file   Highest education level: Not on file  Occupational History   Not on file  Tobacco Use   Smoking status: Every Day    Types: E-cigarettes   Smokeless tobacco: Never    Tobacco comments:    Has vaped for 5 years.  Substance and Sexual Activity   Alcohol use: Not Currently   Drug use: Not Currently   Sexual activity: Yes    Birth control/protection: None  Other Topics Concern   Not on file  Social History Narrative   Not on file   Social Drivers of Health   Tobacco Use: High Risk (02/25/2024)   Patient History    Smoking Tobacco Use: Every Day    Smokeless Tobacco Use: Never    Passive Exposure: Not on file  Financial Resource Strain: Low Risk (02/25/2024)   Overall Financial Resource Strain (CARDIA)    Difficulty of Paying Living Expenses: Not hard at all  Food Insecurity: No Food Insecurity (02/25/2024)   Epic    Worried About Programme Researcher, Broadcasting/film/video in the Last Year: Never true    Ran Out of Food in the Last Year: Never true  Transportation Needs: No Transportation Needs (02/25/2024)   Epic    Lack of Transportation (Medical): No    Lack of Transportation (Non-Medical): No  Physical Activity: Inactive (02/25/2024)   Exercise Vital Sign    Days of Exercise per Week: 0 days    Minutes of Exercise per Session: 0 min  Stress: No Stress Concern Present (02/25/2024)   [employer] of Occupational Health - Occupational Stress Questionnaire    Feeling of Stress: Only a little  Social Connections: Moderately Isolated (02/25/2024)   Social Connection and Isolation Panel    Frequency of Communication with Friends and Family: Twice a week    Frequency of Social Gatherings with Friends and Family: Twice a week    Attends Religious Services: Never    Database Administrator or Organizations: No    Attends Banker Meetings: Never    Marital Status: Living with partner  Intimate Partner Violence: Not At Risk (02/25/2024)   Epic    Fear of Current or Ex-Partner: No    Emotionally Abused: No    Physically Abused: No    Sexually Abused: No  Depression (PHQ2-9): Medium Risk (02/25/2024)   Depression (PHQ2-9)    PHQ-2 Score: 6  Alcohol  Screen: Low Risk (02/25/2024)   Alcohol Screen    Last Alcohol Screening Score (AUDIT): 0  Housing: Unknown (02/25/2024)   Epic    Unable to Pay for Housing in the Last Year: No    Number of Times Moved in the Last Year: Not on file    Homeless in the Last Year: No  Utilities: Not At Risk (02/25/2024)   Epic    Threatened with loss of utilities: No  Health Literacy: Adequate Health Literacy (02/25/2024)   B1300 Health Literacy    Frequency of need for help with medical instructions: Never    Review of Systems  Constitutional:  Negative for chills, fever and malaise/fatigue.  Respiratory:  Negative for cough, sputum production, shortness of breath and wheezing.   Cardiovascular:  Negative for chest pain, palpitations and  leg swelling.  Gastrointestinal:  Negative for constipation, diarrhea, heartburn, melena, nausea and vomiting.  Musculoskeletal:  Positive for joint pain.  Psychiatric/Behavioral:  Negative for depression and suicidal ideas. The patient is not nervous/anxious and does not have insomnia.        Objective    BP 134/87 (BP Location: Left Arm, Patient Position: Sitting, Cuff Size: Normal)   Pulse 70   Temp 98.6 F (37 C) (Oral)   Resp 17   Ht 5' 7.99 (1.727 m)   Wt 193 lb 6.4 oz (87.7 kg)   SpO2 94%   BMI 29.41 kg/m   Physical Exam Vitals and nursing note reviewed.  Constitutional:      General: He is awake. He is not in acute distress.    Appearance: He is well-developed, well-groomed and overweight. He is not ill-appearing or toxic-appearing.  HENT:     Head: Normocephalic.     Right Ear: Hearing and external ear normal.     Left Ear: Hearing and external ear normal.  Eyes:     General: Lids are normal.     Extraocular Movements: Extraocular movements intact.     Conjunctiva/sclera: Conjunctivae normal.  Neck:     Thyroid: No thyromegaly.     Vascular: No carotid bruit.  Cardiovascular:     Rate and Rhythm: Normal rate and regular rhythm.     Heart  sounds: Normal heart sounds. No murmur heard.    No gallop.  Pulmonary:     Effort: No accessory muscle usage or respiratory distress.     Breath sounds: Normal breath sounds. No decreased breath sounds, wheezing or rales.  Abdominal:     General: Bowel sounds are normal. There is no distension.     Palpations: Abdomen is soft.     Tenderness: There is no abdominal tenderness.  Musculoskeletal:     Right hand: Swelling present. No tenderness or bony tenderness. Decreased range of motion. Normal pulse.     Left hand: Normal.     Cervical back: Full passive range of motion without pain.     Right lower leg: No edema.     Left lower leg: No edema.     Comments: To right ring finger there is mild swelling to PIP. No tenderness. Able to move finger, but at baseline is staying slightly bent.  Lymphadenopathy:     Cervical: No cervical adenopathy.  Skin:    General: Skin is warm.     Capillary Refill: Capillary refill takes less than 2 seconds.  Neurological:     Mental Status: He is alert and oriented to person, place, and time.     Deep Tendon Reflexes: Reflexes are normal and symmetric.     Reflex Scores:      Brachioradialis reflexes are 2+ on the right side and 2+ on the left side.      Patellar reflexes are 2+ on the right side and 2+ on the left side. Psychiatric:        Attention and Perception: Attention normal.        Mood and Affect: Mood normal.        Speech: Speech normal.        Behavior: Behavior normal. Behavior is cooperative.        Thought Content: Thought content normal.       Assessment & Plan:   Problem List Items Addressed This Visit       Respiratory   OSA (obstructive sleep apnea)   Chronic,  ongoing. Good use of CPAP, 85% over past year. Praised for using often, continue this and assist with new equipment as needed.        Digestive   Irritable bowel syndrome   Stable, recommend he take Metamucil daily to assist. Reduce use if diarrhea presents.  Labs next visit.        Other   Overweight   BMI 29.41. Recommended eating smaller high protein, low fat meals more frequently and exercising 30 mins a day 5 times a week with a goal of 10-15lb weight loss in the next 3 months. Patient voiced their understanding and motivation to adhere to these recommendations. Labs next visit.       Finger pain, right - Primary   For 2 months after injury at work. Will obtain imaging to further assess and determine next steps after imaging returns. Continue Ibuprofen  as needed and rest after work. Ice may be applied as needed.      Relevant Orders   DG Hand Complete Right   Other Visit Diagnoses       Encounter to establish care       New patient to clinic, introduced to provider and clinic setting.       Return in about 4 weeks (around 03/24/2024) for Annual Physical.   Kaisei Gilbo T Zamire Whitehurst, NP   "

## 2024-02-25 NOTE — Assessment & Plan Note (Addendum)
 BMI 29.41. Recommended eating smaller high protein, low fat meals more frequently and exercising 30 mins a day 5 times a week with a goal of 10-15lb weight loss in the next 3 months. Patient voiced their understanding and motivation to adhere to these recommendations. Labs next visit.

## 2024-02-25 NOTE — Assessment & Plan Note (Signed)
 Chronic, ongoing. Good use of CPAP, 85% over past year. Praised for using often, continue this and assist with new equipment as needed.

## 2024-02-25 NOTE — Assessment & Plan Note (Signed)
 Stable, recommend he take Metamucil daily to assist. Reduce use if diarrhea presents. Labs next visit.

## 2024-02-25 NOTE — Assessment & Plan Note (Signed)
 For 2 months after injury at work. Will obtain imaging to further assess and determine next steps after imaging returns. Continue Ibuprofen  as needed and rest after work. Ice may be applied as needed.

## 2024-03-30 ENCOUNTER — Encounter: Admitting: Nurse Practitioner
